# Patient Record
Sex: Female | Born: 1985 | Hispanic: Yes | Marital: Single | Smoking: Never smoker
Health system: Southern US, Community
[De-identification: ages and names within clinical notes are randomized; demographics above are authoritative.]

---

## 2017-11-01 ENCOUNTER — Encounter: Payer: Self-pay | Admitting: Emergency Medicine

## 2017-11-01 DIAGNOSIS — E041 Nontoxic single thyroid nodule: Secondary | ICD-10-CM | POA: Insufficient documentation

## 2017-11-01 LAB — COMPREHENSIVE METABOLIC PANEL
ALK PHOS: 81 U/L (ref 38–126)
ALT: 28 U/L (ref 14–54)
ANION GAP: 9 (ref 5–15)
AST: 31 U/L (ref 15–41)
Albumin: 4.5 g/dL (ref 3.5–5.0)
BUN: 10 mg/dL (ref 6–20)
CALCIUM: 8.9 mg/dL (ref 8.9–10.3)
CO2: 23 mmol/L (ref 22–32)
Chloride: 105 mmol/L (ref 101–111)
Creatinine, Ser: 0.59 mg/dL (ref 0.44–1.00)
GFR calc Af Amer: >60 mL/min (ref >60)
GFR calc non Af Amer: >60 mL/min (ref >60)
GLUCOSE: 105 mg/dL — AB (ref 65–99)
Potassium: 3.5 mmol/L (ref 3.5–5.1)
SODIUM: 137 mmol/L (ref 135–145)
Total Bilirubin: 0.4 mg/dL (ref 0.3–1.2)
Total Protein: 8.2 g/dL — ABNORMAL HIGH (ref 6.5–8.1)

## 2017-11-01 LAB — CBC WITH DIFFERENTIAL/PLATELET
BASOS ABS: 0.1 10*3/uL (ref 0–0.1)
BASOS PCT: 1 %
EOS ABS: 1.3 10*3/uL — AB (ref 0–0.7)
Eosinophils Relative: 12 %
HCT: 36.4 % (ref 35.0–47.0)
HEMOGLOBIN: 13 g/dL (ref 12.0–16.0)
Lymphocytes Relative: 24 %
Lymphs Abs: 2.7 10*3/uL (ref 1.0–3.6)
MCH: 30.2 pg (ref 26.0–34.0)
MCHC: 35.7 g/dL (ref 32.0–36.0)
MCV: 84.4 fL (ref 80.0–100.0)
MONOS PCT: 6 %
Monocytes Absolute: 0.7 10*3/uL (ref 0.2–0.9)
NEUTROS PCT: 57 %
Neutro Abs: 6.3 10*3/uL (ref 1.4–6.5)
Platelets: 372 10*3/uL (ref 150–440)
RBC: 4.31 MIL/uL (ref 3.80–5.20)
RDW: 13.2 % (ref 11.5–14.5)
WBC: 11.1 10*3/uL — AB (ref 3.6–11.0)

## 2017-11-01 NOTE — ED Triage Notes (Signed)
Patient with swelling to front of her neck times 8 days. Patient states that she has burning with swallowing but denies any difficulty breathing.

## 2017-11-02 ENCOUNTER — Emergency Department: Payer: Self-pay

## 2017-11-02 ENCOUNTER — Emergency Department
Admission: EM | Admit: 2017-11-02 | Discharge: 2017-11-02 | Disposition: A | Discharge status: Home / Self Care | Payer: Self-pay | Attending: Emergency Medicine | Admitting: Emergency Medicine

## 2017-11-02 DIAGNOSIS — R221 Localized swelling, mass and lump, neck: Secondary | ICD-10-CM

## 2017-11-02 DIAGNOSIS — E041 Nontoxic single thyroid nodule: Secondary | ICD-10-CM

## 2017-11-02 LAB — T4, FREE: FREE T4: 0.92 ng/dL (ref 0.82–1.77)

## 2017-11-02 LAB — TSH: TSH: 3.324 u[IU]/mL (ref 0.350–4.500)

## 2017-11-02 MED ORDER — PREDNISONE 10 MG PO TABS: ORAL_TABLET | ORAL | 0 refills | Status: DC

## 2017-11-02 MED ORDER — AMOXICILLIN-POT CLAVULANATE 875-125 MG PO TABS: 1.0000 | ORAL_TABLET | Freq: Two times a day (BID) | ORAL | 0 refills | Status: DC

## 2017-11-02 MED ORDER — PREDNISONE 20 MG PO TABS
60.0000 mg | ORAL_TABLET | ORAL | Status: AC
Start: 2017-11-02 — End: 2017-11-02
  Administered 2017-11-02: 60 mg via ORAL
  Filled 2017-11-02: qty 6

## 2017-11-02 MED ORDER — AMOXICILLIN-POT CLAVULANATE 875-125 MG PO TABS
1.0000 | ORAL_TABLET | Freq: Once | ORAL | Status: AC
  Administered 2017-11-02: 1 via ORAL
  Filled 2017-11-02: qty 1

## 2017-11-02 NOTE — Discharge Instructions (Signed)
Por favor, vaya a la oficina el lunes por la maana a las 8:15 am. Dgales que el Dr. Andee PolesVaught le dijo que tuviera una cita a primera hora de la maana despus de haber acudido al Departamento de Emergencias durante el fin de [place].  Regrese al departamento de emergencias si presenta sntomas nuevos o que empeoran y [employer] le preocupan.  (Please go to the office on Monday morning at 8:15am.  Tell them that Dr. Andee PolesVaught told you to have an appointment first thing in the morning after you came to the Emergency Department over the weekend.  Return to the emergency department if you develop new or worsening symptoms that concern you.)

## 2017-11-02 NOTE — ED Notes (Signed)
Patient transported to Ultrasound 

## 2017-11-02 NOTE — ED Notes (Signed)
Patient is resting comfortably. 

## 2017-11-02 NOTE — ED Provider Notes (Signed)
[facility] Emergency Department Provider Note  ____________________________________________   First MD Initiated Contact with Patient 11/02/17 0105     (approximate)  I have reviewed the triage vital signs and the nursing notes.   HISTORY  Chief Complaint No chief complaint on file.  The patient and/or family speak(s) Spanish.  They understand they have the right to the use of a hospital interpreter, however at this time they prefer to speak directly with me in Spanish.  They know that they can ask for an interpreter at any time.   HPI Wanda Compton is a 32 y.o. female who is otherwise healthy with no chronic medical issues.  She presents by private vehicle for evaluation of a painful lump at the front of her neck.  She reports that she started developing some swelling in the lower part of her anterior neck about 8 days ago.  When she woke up this morning it was painful.  She reports it is painful to the touch and it is a burning pain when she swallows but she is not having any difficulty swallowing or breathing.  She denies fever/chills, chest pain, shortness of breath, nausea, vomiting, abdominal pain, and dysuria.  She has no history of thyroid dysfunction or anything like this in the past.  She denies any palpitations or sensation of racing heart or discomfort in the chest.  No recent weight loss.  History reviewed. No pertinent past medical history.  There are no active problems to display for this patient.   History reviewed. No pertinent surgical history.  Prior to Admission medications   Medication Sig Start Date End Date Taking? Authorizing Provider  amoxicillin-clavulanate (AUGMENTIN) 875-125 MG tablet Take 1 tablet by mouth every 12 (twelve) hours for 10 days. 11/02/17 11/12/17  Loleta Rose, MD  predniSONE (DELTASONE) 10 MG tablet Take 4 tabs (40 mg) PO x 3 days, then take 2 tabs (20 mg) PO x 3 days, then take 1 tab (10 mg) PO x 3 days,  then take 1/2 tab (5 mg) PO x 4 days. 11/02/17   Loleta Rose, MD    Allergies Patient has no known allergies.  No family history on file.  Social History Social History   Tobacco Use  . Smoking status: Never Smoker  . Smokeless tobacco: Never Used  Substance Use Topics  . Alcohol use: Not on file  . Drug use: Not on file    Review of Systems Constitutional: No fever/chills Eyes: No visual changes. ENT: Anterior neck swelling x8 days, now painful and burning pain when swallowing.   Cardiovascular: Denies chest pain. Respiratory: Denies shortness of breath. Gastrointestinal: No abdominal pain.  No nausea, no vomiting.  No diarrhea.  No constipation. Genitourinary: Negative for dysuria. Musculoskeletal: Negative for neck pain.  Negative for back pain. Integumentary: Negative for rash. Neurological: Negative for headaches, focal weakness or numbness.   ____________________________________________   PHYSICAL EXAM:  VITAL SIGNS: ED Triage Vitals  Enc Vitals Group     BP 11/01/17 2214 116/79     Pulse Rate 11/01/17 2214 67     Resp 11/01/17 2214 18     Temp 11/01/17 2214 98.3 F (36.8 C)     Temp Source 11/01/17 2214 Oral     SpO2 11/01/17 2214 99 %     Weight 11/01/17 2215 59 kg (130 lb)     Height 11/01/17 2215 1.49 m (4' 10.66")     Head Circumference --      Peak  Flow --      Pain Score 11/01/17 2214 9     Pain Loc --      Pain Edu? --      Excl. in GC? --     Constitutional: Alert and oriented. Well appearing and in no acute distress. Eyes: Conjunctivae are normal.  Head: Atraumatic. Nose: No congestion/rhinnorhea. Mouth/Throat: Mucous membranes are moist.  Oropharynx is nonerythematous with no evidence of abscess or infection. Neck: Firm, tender neck mass, easily visible and palpable, at the approximate level of the thyroid (inferior to the larynx).  No stridor.  No meningeal signs.   Cardiovascular: Normal rate, regular rhythm. Good peripheral  circulation. Grossly normal heart sounds. Respiratory: Normal respiratory effort.  No retractions. Lungs CTAB. Gastrointestinal: Soft and nontender. No distention.  Musculoskeletal: No lower extremity tenderness nor edema. No gross deformities of extremities. Neurologic:  Normal speech and language. No gross focal neurologic deficits are appreciated.  Skin:  Skin is warm, dry and intact. No rash noted. Psychiatric: Mood and affect are normal. Speech and behavior are normal.  ____________________________________________   LABS (all labs ordered are listed, but only abnormal results are displayed)  Labs Reviewed  CBC WITH DIFFERENTIAL/PLATELET - Abnormal; Notable for the following components:      Result Value   WBC 11.1 (*)    Eosinophils Absolute 1.3 (*)    All other components within normal limits  COMPREHENSIVE METABOLIC PANEL - Abnormal; Notable for the following components:   Glucose, Bld 105 (*)    Total Protein 8.2 (*)    All other components within normal limits  T4, FREE  TSH   ____________________________________________  EKG  None - EKG not ordered by ED physician ____________________________________________  RADIOLOGY   ED MD interpretation:  Thyroid nodule  Official radiology report(s): US Soft Tissue Head & Neck (non-thyroid)  Result Date: 11/02/2017 CLINICAL DATA:  Acute onset of anterior neck mass and pain on swallowing. EXAM: ULTRASOUND OF HEAD/NECK SOFT TISSUES TECHNIQUE: Ultrasound examination of the head and neck soft tissues was performed in the area of clinical concern. COMPARISON:  None. FINDINGS: Parenchymal Echotexture: Normal Isthmus: 1.7 cm Right lobe: 4.7 x 1.4 x 1.3 cm Left lobe: 4.0 x 1.2 x 1.7 cm _________________________________________________________ Estimated total number of nodules >/= 1 cm: 1 Number of spongiform nodules >/= 2 cm not described below (TR1): 0 Number of mixed cystic and solid nodules >/= 1.5 cm not described below (TR2): 0  _________________________________________________________ A solid heterogeneous 2.3 x 1.3 x 2.2 cm nodule is noted at the thyroid isthmus. It is isoechoic in nature, wider than tall, and has mildly lobulated margins. No echogenic foci or calcifications are seen. This has a score of 5 points, and a classification of TR4. The surrounding soft tissues are unremarkable in appearance. No abnormal fluid collection is seen. IMPRESSION: Single solid 2.3 cm thyroid isthmus nodule with classification of TR4. **Given size (>/= 1.5 cm) and appearance, fine needle aspiration of this moderately suspicious nodule should be considered based on TI-RADS criteria. Surrounding soft tissues are unremarkable in appearance. Electronically Signed   By: Roanna Raider M.D.   On: 11/02/2017 03:05    ____________________________________________   PROCEDURES  Critical Care performed: No   Procedure(s) performed:   Procedures   ____________________________________________   INITIAL IMPRESSION / ASSESSMENT AND PLAN / ED COURSE  As part of my medical decision making, I reviewed the following data within the electronic MEDICAL RECORD NUMBER Nursing notes reviewed and incorporated, Labs reviewed  and A  phone consult was requested and obtained from this/these consultant(s) ENT    Differential diagnosis includes, but is not limited to, goiter or other thyroid mass including the possibility of thyroid cancer, infectious process such as abscess or neck infection, laryngeal mass, etc.  The patient is having odynophagia with no dysphasia and has an obvious and tender firm anterior neck mass.  I will evaluate first with an ultrasound of the thyroid and if this is nondiagnostic or inconclusive I will obtain a CT scan of the neck with IV contrast..  Vital signs are stable and she is having no signs or symptoms of Graves' disease or hyperthyroidism.  Labs are all reassuring including a normal TSH and free T4.  She has no airway or  esophageal obstruction.  Patient is stable and agrees with my current plan for evaluation.   Clinical Course as of Nov 03 407  Sat Nov 02, 2017  0237 I spoke by phone with Dr. Andee PolesVaught with ENT.  He agreed that there is no indication for emergent CT scan of the neck.  He recommended that I start the patient on Augmentin and a prednisone taper, the first dose of each I gave to her tonight.  He wants to see her first thing in the morning on Monday and suggested that I tell her to show up at clinic at 8:15 AM on Monday morning until at the clinic know that it was at his request that she show up.  I will await the official results from radiology but anticipate discharge and close outpatient follow-up.   [CF]  0400 Ultrasound demonstrated a nodule, no indication for further work-up tonight as described above.  I went over the results and stressed the importance of close outpatient follow-up.  Her nurse is going over her medications and discharge paperwork with her with the Spanish interpreter.  I gave strict return precautions if she were to develop some issues with swallowing or breathing.   [CF]    Clinical Course User Index [CF] Loleta RoseForbach, Persia Lintner, MD    ____________________________________________  FINAL CLINICAL IMPRESSION(S) / ED DIAGNOSES  Final diagnoses:  Neck mass  Thyroid nodule     MEDICATIONS GIVEN DURING THIS VISIT:  Medications  amoxicillin-clavulanate (AUGMENTIN) 875-125 MG per tablet 1 tablet (has no administration in time range)  predniSONE (DELTASONE) tablet 60 mg (60 mg Oral Given 11/02/17 0403)     ED Discharge Orders        Ordered    amoxicillin-clavulanate (AUGMENTIN) 875-125 MG tablet  Every 12 hours     11/02/17 0239    predniSONE (DELTASONE) 10 MG tablet     11/02/17 0239       Note:  This document was prepared using Dragon voice recognition software and may include unintentional dictation errors.    Loleta RoseForbach, Jonalyn Sedlak, MD 11/02/17 [phone]

## 2017-11-11 ENCOUNTER — Other Ambulatory Visit: Payer: Self-pay | Admitting: Otolaryngology

## 2017-11-11 DIAGNOSIS — E041 Nontoxic single thyroid nodule: Secondary | ICD-10-CM

## 2017-11-13 ENCOUNTER — Ambulatory Visit
Admission: RE | Admit: 2017-11-13 | Discharge: 2017-11-13 | Disposition: A | Discharge status: Home / Self Care | Payer: Self-pay | Source: Ambulatory Visit | Attending: Otolaryngology | Admitting: Otolaryngology

## 2017-11-13 DIAGNOSIS — E041 Nontoxic single thyroid nodule: Secondary | ICD-10-CM | POA: Insufficient documentation

## 2017-11-13 NOTE — Procedures (Signed)
US bx of isthmic thyroid nodule without difficulty  Complications:  None  Blood Loss: none  See dictation in canopy pacs

## 2017-11-15 ENCOUNTER — Other Ambulatory Visit: Payer: Self-pay | Admitting: Pathology

## 2017-11-15 LAB — CYTOLOGY - NON PAP

## 2020-02-05 IMAGING — US US FNA BIOPSY THYROID 1ST LESION
1 series · 7 of 7 positions shown · non-contrast
Comparison: 11/02/2017

MEDICATIONS:
None

COMPLICATIONS:
None immediate.

INDICATION: Indeterminate thyroid nodule

EXAM:
ULTRASOUND GUIDED FINE NEEDLE ASPIRATION OF INDETERMINATE THYROID
NODULE
TECHNIQUE: Informed written consent was obtained from the patient after a
discussion of the risks, benefits and alternatives to treatment.
Questions regarding the procedure were encouraged and answered. A
timeout was performed prior to the initiation of the procedure.

[Series 1: us fna biopsy thyroid 1st lesion · 7 of 7 slices shown]
[im 1/7]
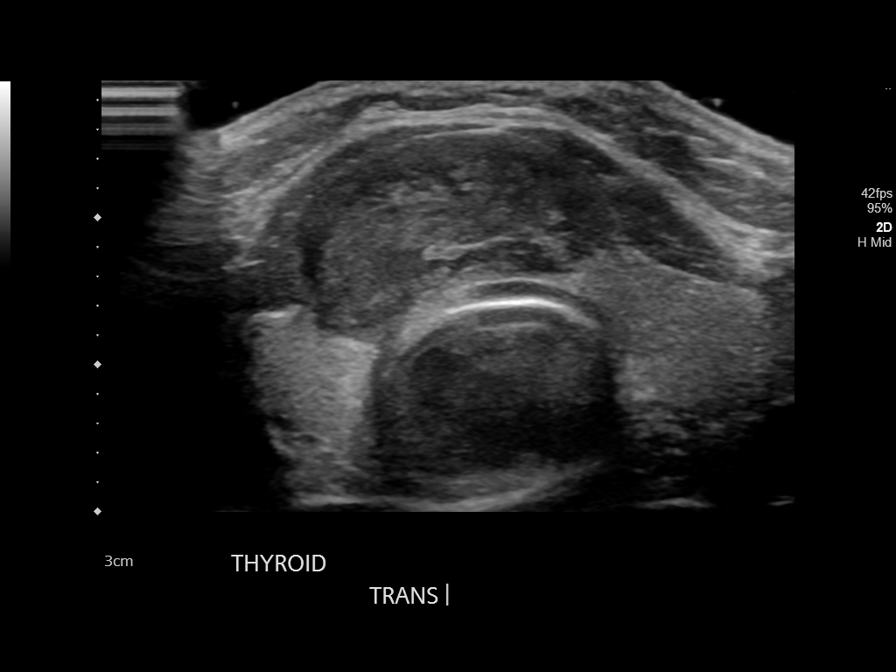
[im 2/7]
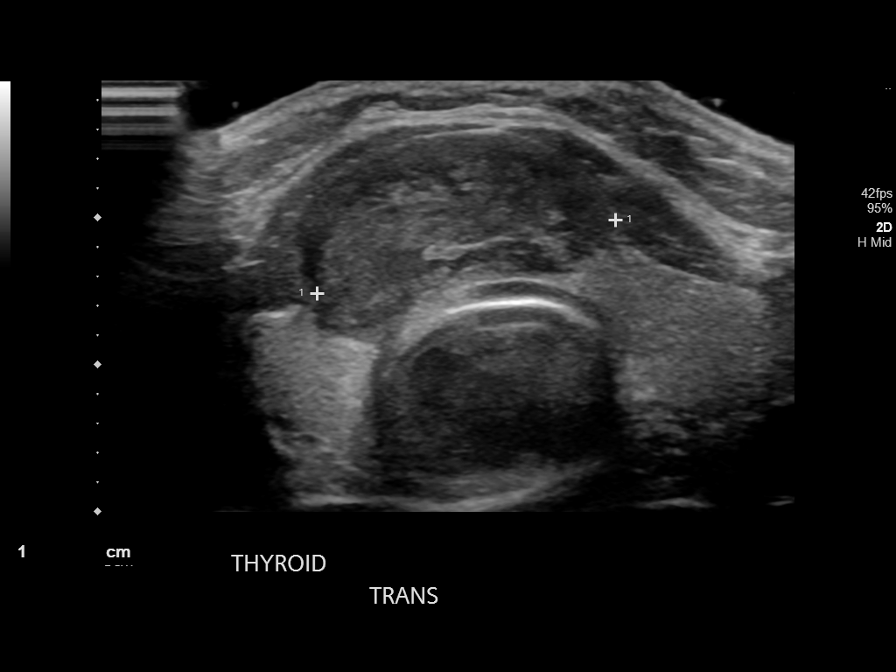
[im 3/7]
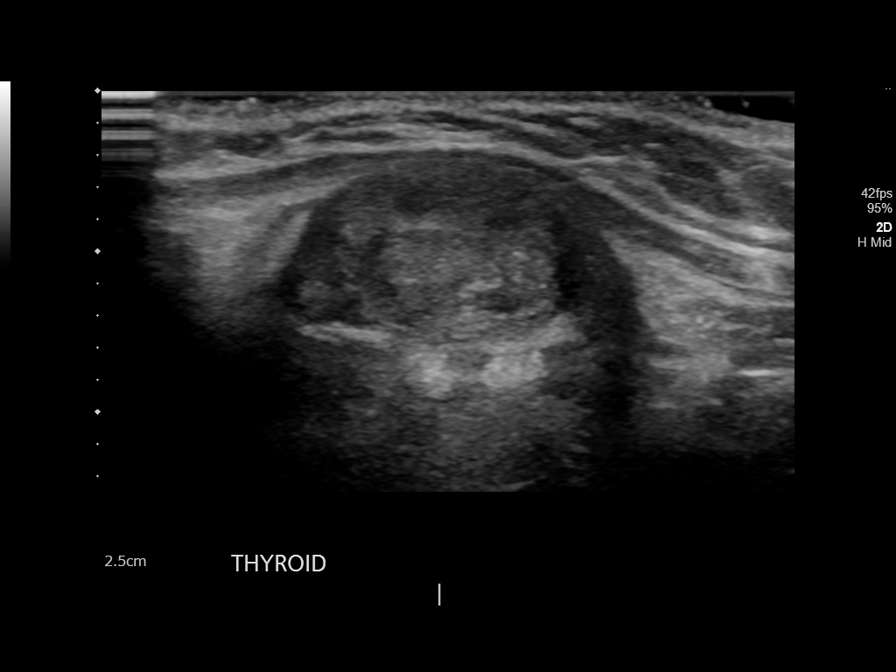
[im 4/7]
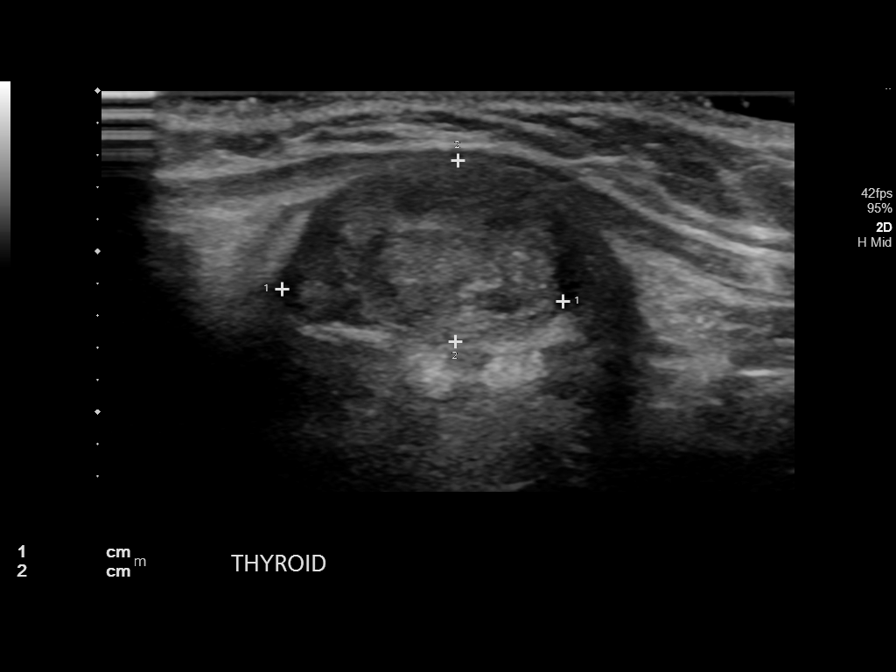
[im 5/7]
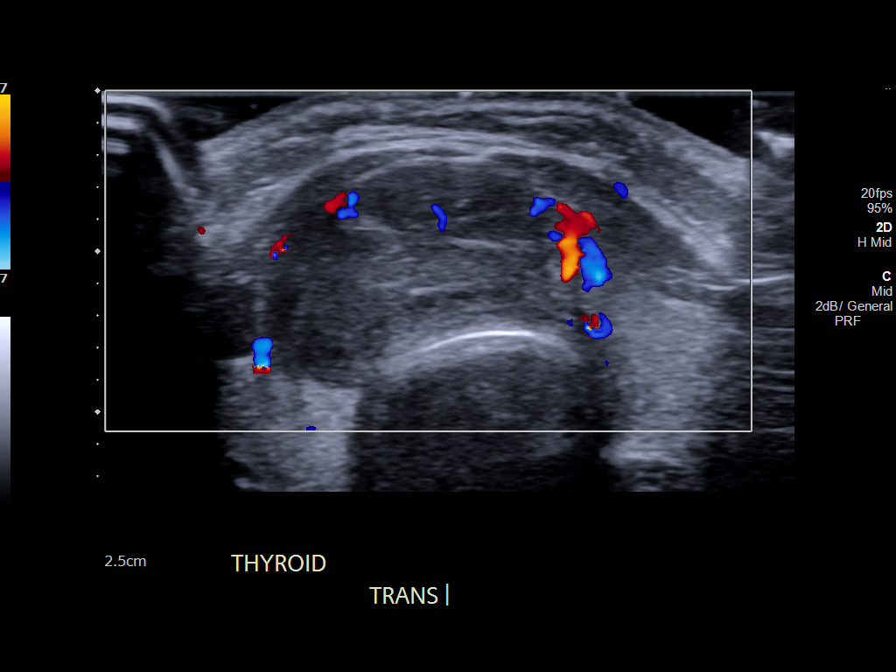
[im 6/7]
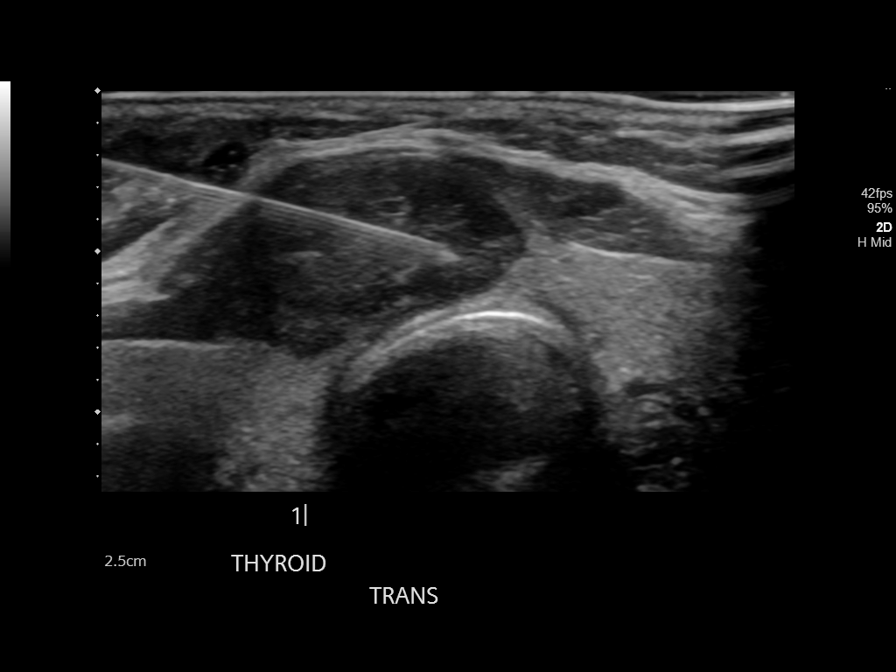
[im 7/7]
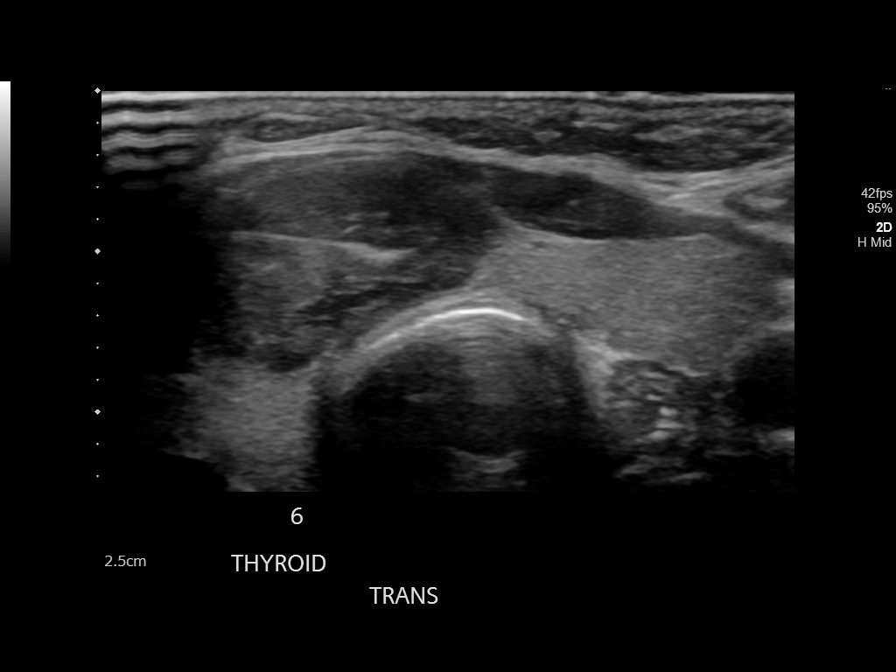

[7 of 7 positions shown; findings below may reference images not displayed]

Pre-procedural ultrasound scanning demonstrated unchanged size and
appearance of the indeterminate nodule within the isthmus

The procedure was planned. The neck was prepped in the usual sterile
fashion, and a sterile drape was applied covering the operative
field. A timeout was performed prior to the initiation of the
procedure. Local anesthesia was provided with 1% lidocaine.

Under direct ultrasound guidance, 4 FNA biopsies were performed of
the isthmic nodule with a 25 gauge needle. Multiple ultrasound
images were saved for procedural documentation purposes. The samples
were prepared and submitted to pathology. Additionally 2 separate
samples for Afirma testing were performed.

Limited post procedural scanning was negative for hematoma or
additional complication. Dressings were placed. The patient
tolerated the above procedures procedure well without immediate
postprocedural complication.
FINDINGS: FINDINGS
Nodule reference number based on prior diagnostic ultrasound: 1

Maximum size: 2.3 cm

Location: Isthmus

ACR TI-RADS total points: 5

ACR TI-RADS risk category:  TR4 (4-6 points)

Prior biopsy:  None

Reason for biopsy: meets ACR TI-RADS criteria

Ultrasound imaging confirms appropriate placement of the needles
within the thyroid nodule.
IMPRESSION: Technically successful ultrasound guided fine needle aspiration of
isthmic thyroid nodule

## 2023-01-22 ENCOUNTER — Emergency Department
Admission: EM | Admit: 2023-01-22 | Discharge: 2023-01-23 | Disposition: A | Discharge status: Home / Self Care | Payer: Self-pay | Attending: Emergency Medicine | Admitting: Emergency Medicine

## 2023-01-22 ENCOUNTER — Emergency Department: Payer: Self-pay

## 2023-01-22 ENCOUNTER — Other Ambulatory Visit: Payer: Self-pay

## 2023-01-22 ENCOUNTER — Encounter: Payer: Self-pay | Admitting: Emergency Medicine

## 2023-01-22 DIAGNOSIS — R1013 Epigastric pain: Secondary | ICD-10-CM | POA: Insufficient documentation

## 2023-01-22 DIAGNOSIS — R42 Dizziness and giddiness: Secondary | ICD-10-CM | POA: Insufficient documentation

## 2023-01-22 DIAGNOSIS — R112 Nausea with vomiting, unspecified: Secondary | ICD-10-CM | POA: Insufficient documentation

## 2023-01-22 LAB — URINALYSIS, ROUTINE W REFLEX MICROSCOPIC
Bacteria, UA: NONE SEEN
Bilirubin Urine: NEGATIVE
Glucose, UA: NEGATIVE mg/dL
Hgb urine dipstick: NEGATIVE
Ketones, ur: 20 mg/dL — AB
Leukocytes,Ua: NEGATIVE
Nitrite: NEGATIVE
Protein, ur: 100 mg/dL — AB
Specific Gravity, Urine: 1.036 — ABNORMAL HIGH (ref 1.005–1.030)
pH: 5 (ref 5.0–8.0)

## 2023-01-22 LAB — CBC
HCT: 38.2 % (ref 36.0–46.0)
Hemoglobin: 12.7 g/dL (ref 12.0–15.0)
MCH: 27.7 pg (ref 26.0–34.0)
MCHC: 33.2 g/dL (ref 30.0–36.0)
MCV: 83.2 fL (ref 80.0–100.0)
Platelets: 298 10*3/uL (ref 150–400)
RBC: 4.59 MIL/uL (ref 3.87–5.11)
RDW: 14.2 % (ref 11.5–15.5)
WBC: 5.8 10*3/uL (ref 4.0–10.5)
nRBC: 0 % (ref 0.0–0.2)

## 2023-01-22 LAB — BASIC METABOLIC PANEL
Anion gap: 9 (ref 5–15)
BUN: 13 mg/dL (ref 6–20)
CO2: 21 mmol/L — ABNORMAL LOW (ref 22–32)
Calcium: 8.7 mg/dL — ABNORMAL LOW (ref 8.9–10.3)
Chloride: 108 mmol/L (ref 98–111)
Creatinine, Ser: 0.6 mg/dL (ref 0.44–1.00)
GFR, Estimated: >60 mL/min (ref >60)
Glucose, Bld: 98 mg/dL (ref 70–99)
Potassium: 3.7 mmol/L (ref 3.5–5.1)
Sodium: 138 mmol/L (ref 135–145)

## 2023-01-22 LAB — POC URINE PREG, ED: Preg Test, Ur: NEGATIVE

## 2023-01-22 LAB — TROPONIN I (HIGH SENSITIVITY): Troponin I (High Sensitivity): <2 ng/L (ref <18)

## 2023-01-22 MED ORDER — ONDANSETRON HCL 4 MG/2ML IJ SOLN
4.0000 mg | INTRAMUSCULAR | Status: AC
  Administered 2023-01-23: 4 mg via INTRAVENOUS
  Filled 2023-01-22: qty 2

## 2023-01-22 MED ORDER — KETOROLAC TROMETHAMINE 30 MG/ML IJ SOLN
15.0000 mg | Freq: Once | INTRAMUSCULAR | Status: AC
  Administered 2023-01-23: 15 mg via INTRAVENOUS
  Filled 2023-01-22: qty 1

## 2023-01-22 MED ORDER — LACTATED RINGERS IV BOLUS
1000.0000 mL | Freq: Once | INTRAVENOUS | Status: AC
  Administered 2023-01-23: 1000 mL via INTRAVENOUS

## 2023-01-22 NOTE — ED Provider Notes (Signed)
[facility] Provider Note    Event Date/Time   First MD Initiated Contact with Patient 01/22/23 2326     (approximate)   History   Dizziness  The patient and/or family speak(s) Spanish.  They understand they have the right to the use of a hospital interpreter, however at this time they prefer to speak directly with me in Spanish.  They know that they can ask for an interpreter at any time.   HPI Wanda Compton is a 37 y.o. female with a history of depression who about a month ago was treated for H. pylori as an outpatient.  She presents tonight for evaluation of 3 days of pain in her abdomen that she says feels full and associated nausea, vomiting, decreased appetite and oral intake, and worsening pain and feeling of fullness when she takes deep breaths.  She said that it is not that she is having trouble breathing, but taking deep breaths makes her abdomen feel tighter and more painful.  She has not been eating or drinking very well as result of feeling badly and she has become dizzy as well.  She has no balance issues and no weakness in her extremities, she just feels weak all over and lightheaded.  No chest pain.  No history of lung disease or heart disease.  No history of blood clots in the legs or the lungs.  No swelling or pain in her legs.  She has an IUD and does not take OCPs.     Physical Exam   Triage Vital Signs: ED Triage Vitals  Encounter Vitals Group     BP 01/22/23 1935 111/70     Systolic BP Percentile --      Diastolic BP Percentile --      Pulse Rate 01/22/23 1935 78     Resp 01/22/23 1935 18     Temp 01/22/23 1935 98.9 F (37.2 C)     Temp Source 01/22/23 1935 Oral     SpO2 01/22/23 1935 100 %     Weight 01/22/23 1928 52.2 kg (115 lb)     Height 01/22/23 1928 1.473 m (4\' 10" )     Head Circumference --      Peak Flow --      Pain Score 01/22/23 1926 0     Pain Loc --      Pain Education --      Exclude from Growth Chart --      Most recent vital signs: Vitals:   01/23/23 0015 01/23/23 0045  BP:    Pulse: 70 64  Resp: 18 20  Temp:    SpO2: 100% 100%    General: Awake, no distress.  Generally well-appearing. CV:  Good peripheral perfusion.  Regular rate and rhythm. Resp:  Normal effort. Speaking easily and comfortably, no accessory muscle usage nor intercostal retractions.  Lungs are clear to auscultation bilaterally.  No pleuritic chest pain identified or reported. Abd:  No distention.  Tenderness to palpation of the epigastrium, equivocal right upper quadrant tenderness and equivocal Murphy sign.  No lower abdominal tenderness.  Healthy body habitus. Other:  No focal neurological deficits appreciated.  No nystagmus, normal and equal grip strength and extremity strength.   ED Results / Procedures / Treatments   Labs (all labs ordered are listed, but only abnormal results are displayed) Labs Reviewed  BASIC METABOLIC PANEL - Abnormal; Notable for the following components:      Result Value   CO2 21 (*)  Calcium 8.7 (*)    All other components within normal limits  URINALYSIS, ROUTINE W REFLEX MICROSCOPIC - Abnormal; Notable for the following components:   Color, Urine AMBER (*)    APPearance HAZY (*)    Specific Gravity, Urine 1.036 (*)    Ketones, ur 20 (*)    Protein, ur 100 (*)    All other components within normal limits  HEPATIC FUNCTION PANEL - Abnormal; Notable for the following components:   Bilirubin, Direct 0.3 (*)    All other components within normal limits  CBC  LIPASE, BLOOD  POC URINE PREG, ED  TROPONIN I (HIGH SENSITIVITY)  TROPONIN I (HIGH SENSITIVITY)     EKG  ED ECG REPORT I, Loleta Rose, the attending physician, personally viewed and interpreted this ECG.  Date: 01/22/2023 EKG Time: 19:29 Rate: 83 Rhythm: normal sinus rhythm QRS Axis: normal Intervals: normal ST/T Wave abnormalities: normal Narrative Interpretation: no evidence of acute  ischemia    RADIOLOGY I viewed and interpreted the patient's 2-view CXR.  No evidence of pneumothorax nor pneumonia.  I also read the radiologist's report, which confirmed no acute findings.  I also viewed and interpreted the patient's right upper quadrant ultrasound.  No acute abnormalities as documented more clearly in the hospital course.   PROCEDURES:  Critical Care performed: No  .1-3 Lead EKG Interpretation  Performed by: Loleta Rose, MD Authorized by: Loleta Rose, MD     Interpretation: normal     ECG rate:  70   ECG rate assessment: normal     Rhythm: sinus rhythm     Ectopy: none     Conduction: normal       IMPRESSION / MDM / ASSESSMENT AND PLAN / ED COURSE  I reviewed the triage vital signs and the nursing notes.                              Differential diagnosis includes, but is not limited to, biliary colic/gallbladder disease, pancreatitis, PE, ACS, H. pylori /ulcer, electrolyte or metabolic abnormality, dehydration.  Patient's presentation is most consistent with acute presentation with potential threat to life or bodily function.  Labs/studies ordered: EKG, right upper quadrant ultrasound, two-view chest x-ray, CBC, BMP, hepatic function panel, lipase, high-sensitivity troponin x 2, urinalysis  Interventions/Medications given:  Medications  lactated ringers bolus 1,000 mL (1,000 mLs Intravenous New Bag/Given 01/23/23 0011)  ondansetron (ZOFRAN) injection 4 mg (4 mg Intravenous Given 01/23/23 0011)  ketorolac (TORADOL) 30 MG/ML injection 15 mg (15 mg Intravenous Given 01/23/23 0011)    (Note:  hospital course my include additional interventions and/or labs/studies not listed above.)   Vital signs reassuring, PERC negative assuming that IUD does not count as exogenous estrogen which is my understanding, regardless will score for PE is 0.  Given that the patient is having primarily epigastric symptoms I think that ulcer (in the setting of recent H.  pylori treatment) versus gallbladder disease is likely to be the cause of her constellation of symptoms, followed by mild dehydration that comes from not eating or drinking well and having multiple episodes of nausea and vomiting over the last few days.  Labs notable for some ketones in the urine but no evidence of infection.  Labs are otherwise essentially normal.  Hepatic function panel and lipase have just been added on and I will follow-up on those results.  Also following up on ultrasound.  Patient receiving IV fluids, Zofran, and Toradol  as listed above to help with symptoms.  The patient is on the cardiac monitor to evaluate for evidence of arrhythmia and/or significant heart rate changes.   Clinical Course as of 01/23/23 0156  Wed Jan 23, 2023  0049 Troponin I (High Sensitivity): 2 [CF]  0139 Normal LFTs and lipase [CF]  0139 US ABDOMEN LIMITED RUQ (LIVER/GB) I viewed and interpreted the ultrasound and I see no evidence of gallstones.  Radiology report concurs that there is no gallstones or evidence of gallbladder wall thickening [CF]  0153 The patient reports that she feels much better after the fluids and the medicines.  I told her about her normal ultrasound and recommended she follow-up with her gastroenterologist as planned.  She agrees with this plan.  No indication for a CT scan tonight.  She says that she is not taking any medicine for reflux like a proton pump inhibitor or H2 blocker, so I wrote her prescriptions as listed below including for Prilosec.  I gave her my usual and customary return precautions and she understands and agrees with the plan. [CF]    Clinical Course User Index [CF] Loleta Rose, MD     FINAL CLINICAL IMPRESSION(S) / ED DIAGNOSES   Final diagnoses:  Nausea and vomiting, unspecified vomiting type  Epigastric pain     Rx / DC Orders   ED Discharge Orders          Ordered    omeprazole (PRILOSEC OTC) 20 MG tablet  Daily        01/23/23 0154     ondansetron (ZOFRAN) 4 MG tablet        01/23/23 0154    sucralfate (CARAFATE) 1 g tablet  4 times daily PRN        01/23/23 0154             Note:  This document was prepared using Dragon voice recognition software and may include unintentional dictation errors.   Loleta Rose, MD 01/23/23 [phone]

## 2023-01-22 NOTE — ED Triage Notes (Signed)
Pt presents ambulatory to triage via POV with complaints of dizziness and SOB x 3 days. Pt endorses dizziness and lower abdominal pain at rest. She states "it feels like a mass" in my stomach causing her to feel SOB. Of note, the patient was recently treated for H. Pylori. A&Ox4 at this time. Denies CP, fevers, chills, cough.

## 2023-01-23 ENCOUNTER — Emergency Department: Payer: Self-pay

## 2023-01-23 LAB — HEPATIC FUNCTION PANEL
ALT: 16 U/L (ref 0–44)
AST: 24 U/L (ref 15–41)
Albumin: 3.9 g/dL (ref 3.5–5.0)
Alkaline Phosphatase: 52 U/L (ref 38–126)
Bilirubin, Direct: 0.3 mg/dL — ABNORMAL HIGH (ref 0.0–0.2)
Indirect Bilirubin: 0.6 mg/dL (ref 0.3–0.9)
Total Bilirubin: 0.9 mg/dL (ref 0.3–1.2)
Total Protein: 7.3 g/dL (ref 6.5–8.1)

## 2023-01-23 LAB — TROPONIN I (HIGH SENSITIVITY): Troponin I (High Sensitivity): 2 ng/L (ref <18)

## 2023-01-23 LAB — LIPASE, BLOOD: Lipase: 40 U/L (ref 11–51)

## 2023-01-23 MED ORDER — OMEPRAZOLE MAGNESIUM 20 MG PO TBEC: 20.0000 mg | DELAYED_RELEASE_TABLET | Freq: Every day | ORAL | 1 refills | Status: AC

## 2023-01-23 MED ORDER — ONDANSETRON HCL 4 MG PO TABS: ORAL_TABLET | ORAL | 0 refills | Status: AC

## 2023-01-23 MED ORDER — SUCRALFATE 1 G PO TABS: 1.0000 g | ORAL_TABLET | Freq: Four times a day (QID) | ORAL | 1 refills | Status: AC | PRN
